# Patient Record
Sex: Male | Born: 1990 | Race: Black or African American | Hispanic: No | Marital: Single | Smoking: Never smoker
Health system: Southern US, Community
[De-identification: ages and names within clinical notes are randomized; demographics above are authoritative.]

---

## 2011-07-18 ENCOUNTER — Inpatient Hospital Stay (INDEPENDENT_AMBULATORY_CARE_PROVIDER_SITE_OTHER)
Admission: RE | Admit: 2011-07-18 | Discharge: 2011-07-18 | Disposition: A | Discharge status: Home / Self Care | Payer: BC Managed Care – PPO | Source: Ambulatory Visit | Attending: Emergency Medicine | Admitting: Emergency Medicine

## 2011-07-18 ENCOUNTER — Ambulatory Visit (INDEPENDENT_AMBULATORY_CARE_PROVIDER_SITE_OTHER): Payer: BC Managed Care – PPO

## 2011-07-18 DIAGNOSIS — S62233A Other displaced fracture of base of first metacarpal bone, unspecified hand, initial encounter for closed fracture: Secondary | ICD-10-CM

## 2013-01-20 IMAGING — CR DG HAND COMPLETE 3+V*R*
3 series · 3 of 3 positions shown · non-contrast
Comparison: None.

CLINICAL DATA: Right thumb injury.  Swollen.

RIGHT HAND - COMPLETE 3+ VIEW

[view not recorded (1 of 3)]
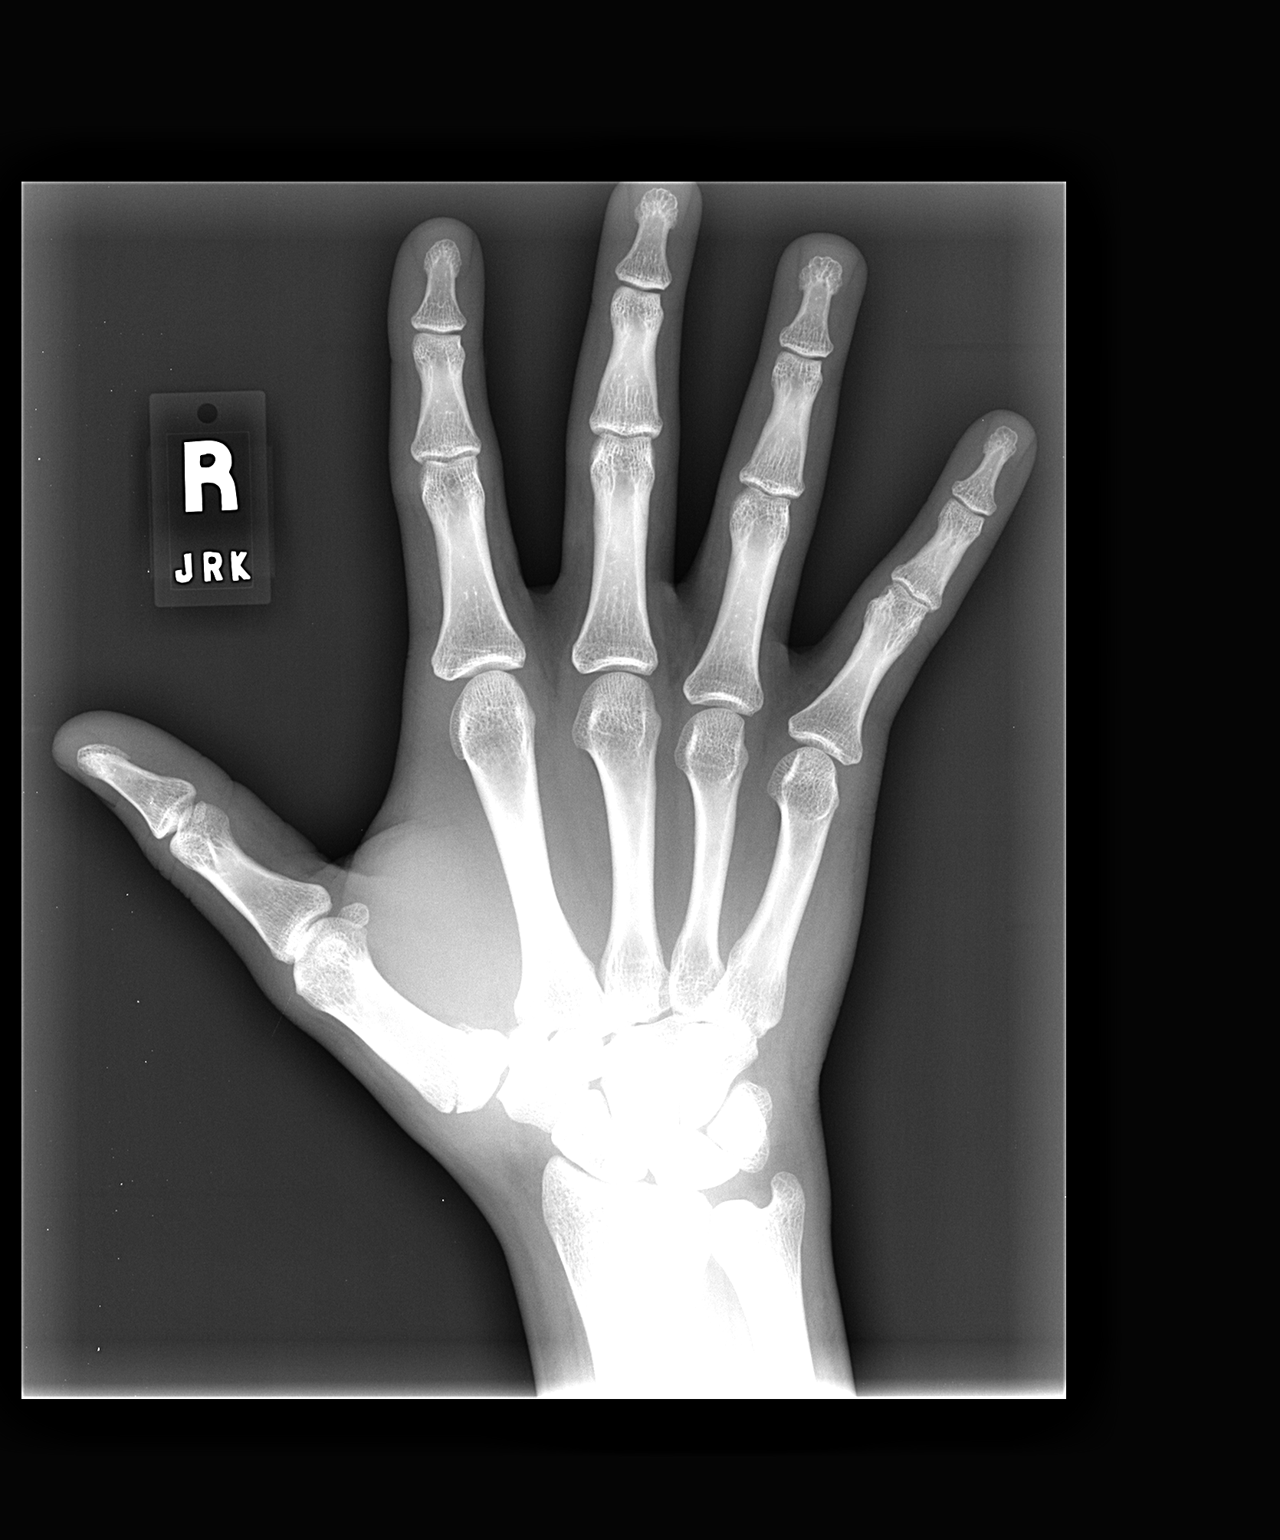

[view not recorded (2 of 3)]
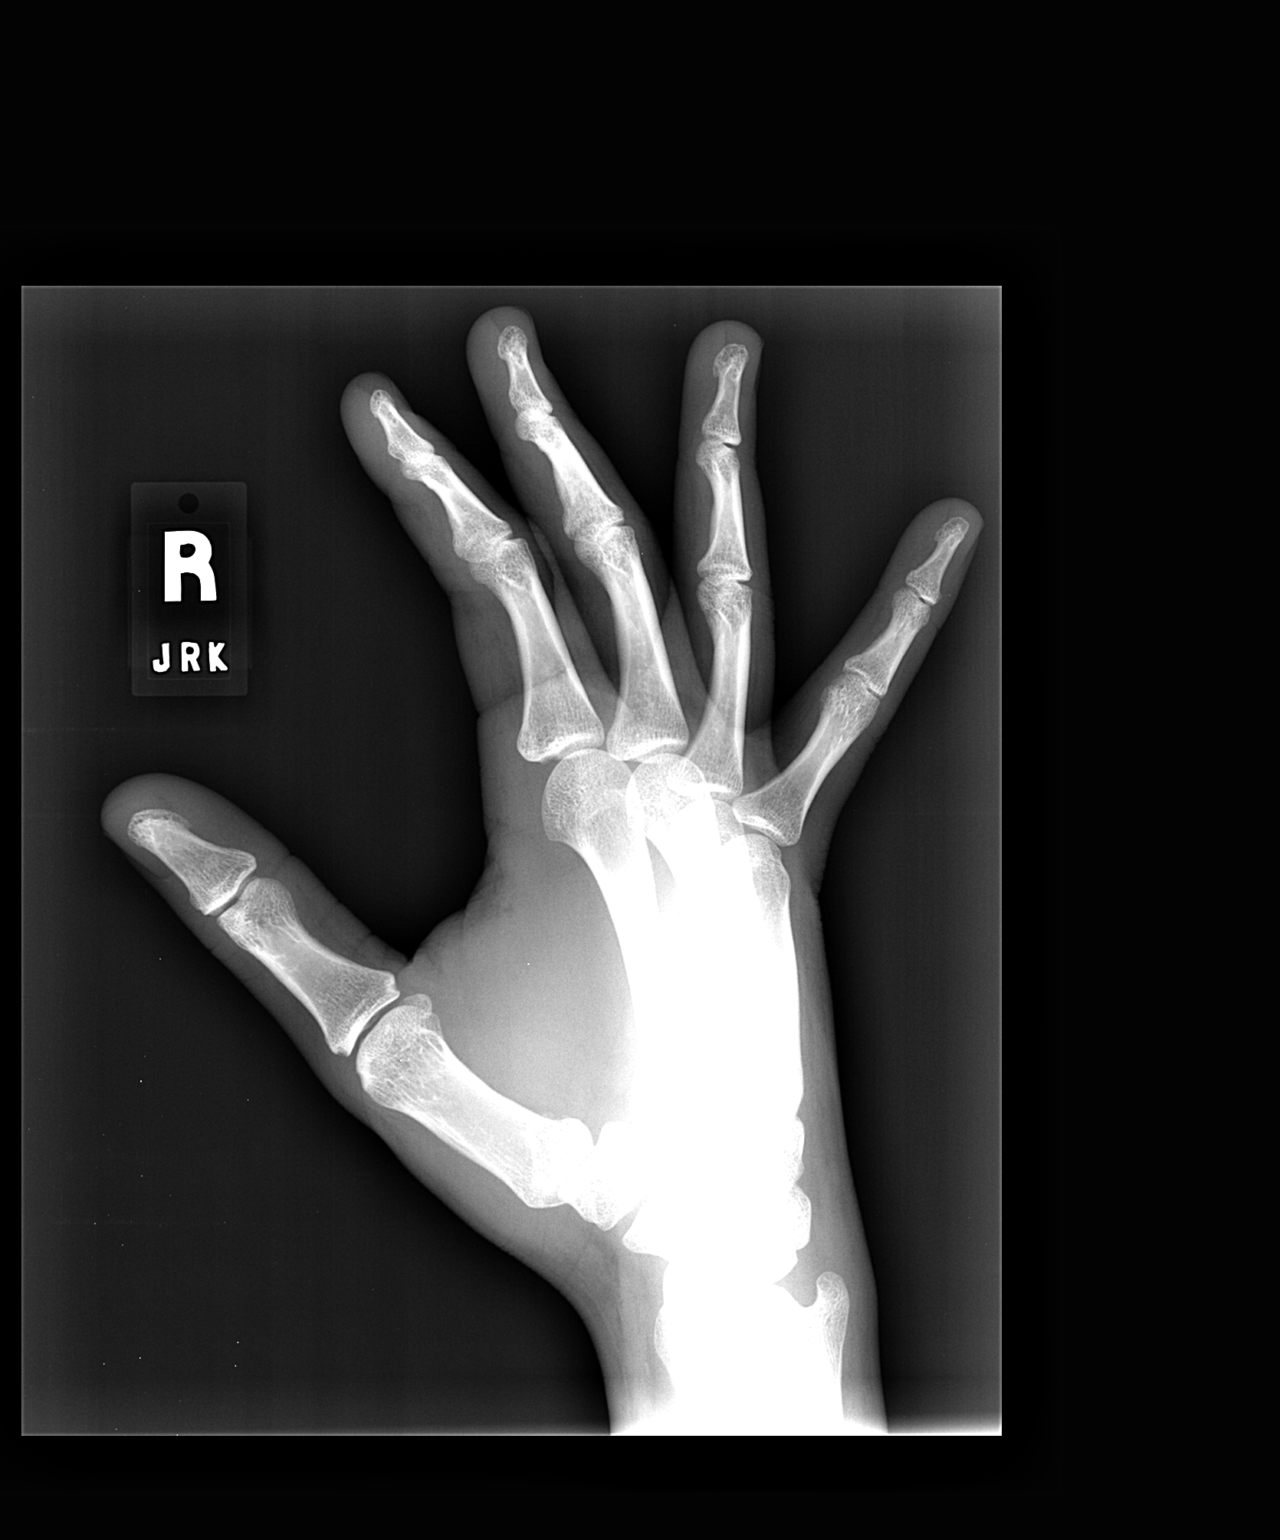

[view not recorded (3 of 3)]
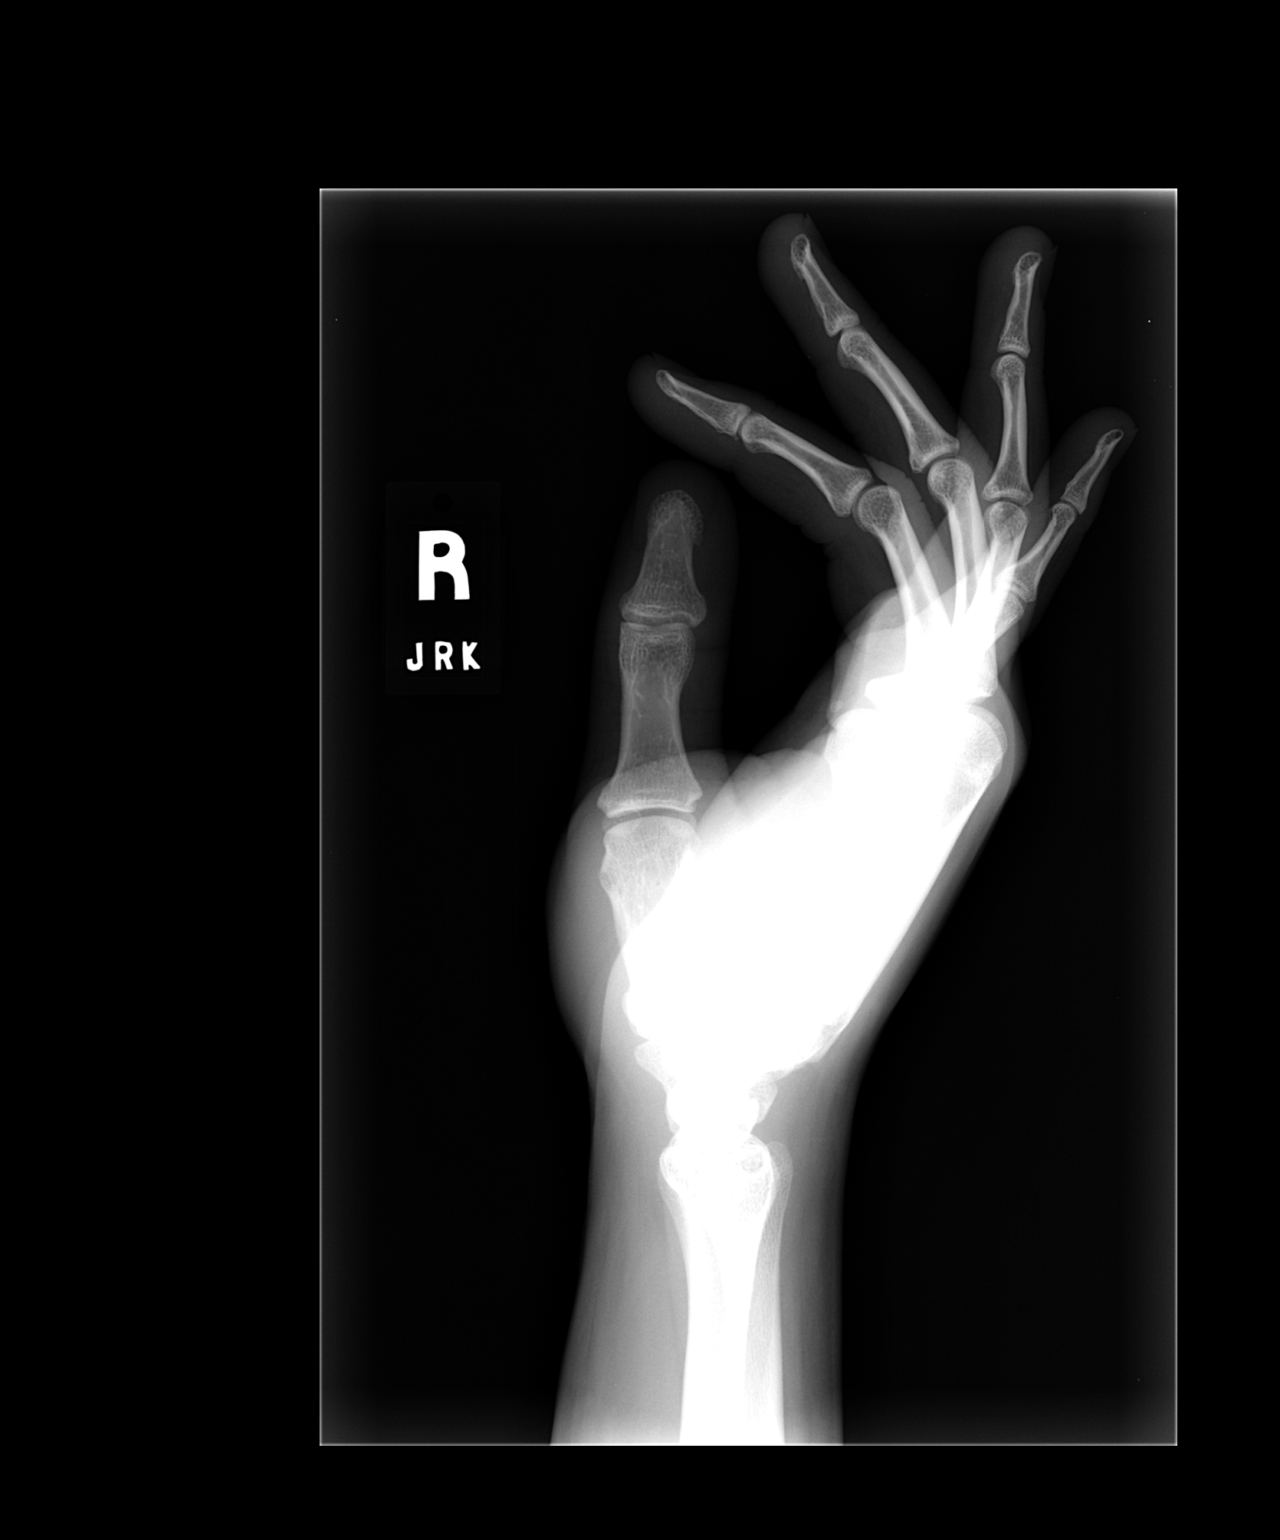

[3 of 3 positions shown; findings below may reference images not displayed]

FINDINGS: Transverse fracture proximal metaphysis of the first
metacarpal noted.

No other acute fracture or acute bony findings are observed.
IMPRESSION: 1.  Transverse fracture of the proximal metaphysis of the first
metacarpal.

## 2014-06-08 ENCOUNTER — Ambulatory Visit (HOSPITAL_COMMUNITY): Payer: BC Managed Care – PPO | Attending: Emergency Medicine

## 2014-06-08 ENCOUNTER — Emergency Department (INDEPENDENT_AMBULATORY_CARE_PROVIDER_SITE_OTHER)
Admission: EM | Admit: 2014-06-08 | Discharge: 2014-06-08 | Disposition: A | Discharge status: Home / Self Care | Payer: BC Managed Care – PPO | Source: Home / Self Care

## 2014-06-08 ENCOUNTER — Encounter (HOSPITAL_COMMUNITY): Payer: Self-pay | Admitting: Emergency Medicine

## 2014-06-08 DIAGNOSIS — S93409A Sprain of unspecified ligament of unspecified ankle, initial encounter: Secondary | ICD-10-CM

## 2014-06-08 DIAGNOSIS — M25579 Pain in unspecified ankle and joints of unspecified foot: Secondary | ICD-10-CM | POA: Insufficient documentation

## 2014-06-08 DIAGNOSIS — S93402A Sprain of unspecified ligament of left ankle, initial encounter: Secondary | ICD-10-CM

## 2014-06-08 DIAGNOSIS — Y9361 Activity, american tackle football: Secondary | ICD-10-CM

## 2014-06-08 NOTE — ED Provider Notes (Signed)
CSN: [id number]     Arrival date & time 06/08/14  1540 History   First MD Initiated Contact with Patient 06/08/14 1553     Chief Complaint  Patient presents with  . Ankle Pain   (Consider location/radiation/quality/duration/timing/severity/associated sxs/prior Treatment) HPI Comments: 23 year old male was playing football 2 days ago and injured his left ankle. He is complaining of pain to the ankle but not the foot. He is unable to place weight on the ankle but with pain.   History reviewed. No pertinent past medical history. History reviewed. No pertinent past surgical history. No family history on file. History  Substance Use Topics  . Smoking status: Never Smoker   . Smokeless tobacco: Not on file  . Alcohol Use: Yes    Review of Systems  Constitutional: Positive for activity change.  Respiratory: Negative.   Gastrointestinal: Negative.   Genitourinary: Negative.   Musculoskeletal: Positive for gait problem and joint swelling. Negative for back pain and neck pain.       As per HPI  Skin: Negative.   Neurological: Negative for dizziness and numbness.    Allergies  Review of patient's allergies indicates not on file.  Home Medications   Prior to Admission medications   Not on File   BP 131/85  Pulse 67  Temp(Src) 98.5 F (36.9 C) (Oral)  Resp 16  SpO2 98% Physical Exam  Nursing note and vitals reviewed. Constitutional: He is oriented to person, place, and time. He appears well-developed and well-nourished.  HENT:  Head: Normocephalic and atraumatic.  Eyes: EOM are normal.  Neck: Normal range of motion. Neck supple.  Cardiovascular: Normal rate.   Musculoskeletal: Normal range of motion. He exhibits edema and tenderness.  N/V, M/S intact. Pedal pulse 2+. Ext and flex intact.No foot tenderness or pain.  Neurological: He is alert and oriented to person, place, and time. No cranial nerve deficit.  Skin: Skin is warm and dry.  Psychiatric: He has a normal mood  and affect.    ED Course  Procedures (including critical care time) Labs Review Labs Reviewed - No data to display  Imaging Review Dg Ankle Complete Left  06/08/2014   CLINICAL DATA:  Twisted left ankle plain rugby, now with lateral ankle pain  EXAM: LEFT ANKLE COMPLETE - 3+ VIEW  COMPARISON:  None.  FINDINGS: Soft tissue swelling about the lateral malleolus. This finding without associated fracture or dislocation. Joint spaces are preserved. Ankle mortise is preserved. No ankle joint effusion.  IMPRESSION: Soft tissue swelling about the lateral malleolus without associated fracture or dislocation.   Electronically Signed   By: Simonne Come M.D.   On: 06/08/2014 16:59     MDM   1. Ankle sprain, left, initial encounter    [facility]    Hayden Rasmussen, NP 06/08/14 1706

## 2014-06-08 NOTE — Discharge Instructions (Signed)

## 2014-06-08 NOTE — ED Notes (Signed)
Pt  Reports  He  Injured  His  l  Ankle  2  Days  Ago     Playing  Football  He  Has  Pain on  Weight bearing  He  Has  Some  Swelling present  Cap  Refill is  Present

## 2014-06-09 NOTE — ED Provider Notes (Signed)
Medical screening examination/treatment/procedure(s) were performed by resident physician or non-physician practitioner and as supervising physician I was immediately available for consultation/collaboration.   Kimla Furth DOUGLAS MD.   Athenia Rys D Analy Bassford, MD 06/09/14 1708 

## 2015-12-12 IMAGING — CR DG ANKLE COMPLETE 3+V*L*
3 series · 3 of 3 positions shown · non-contrast
Comparison: None.

CLINICAL DATA: Twisted left ankle plain rugby, now with lateral
ankle pain

EXAM:
LEFT ANKLE COMPLETE - 3+ VIEW

[t ankle joint ap left]
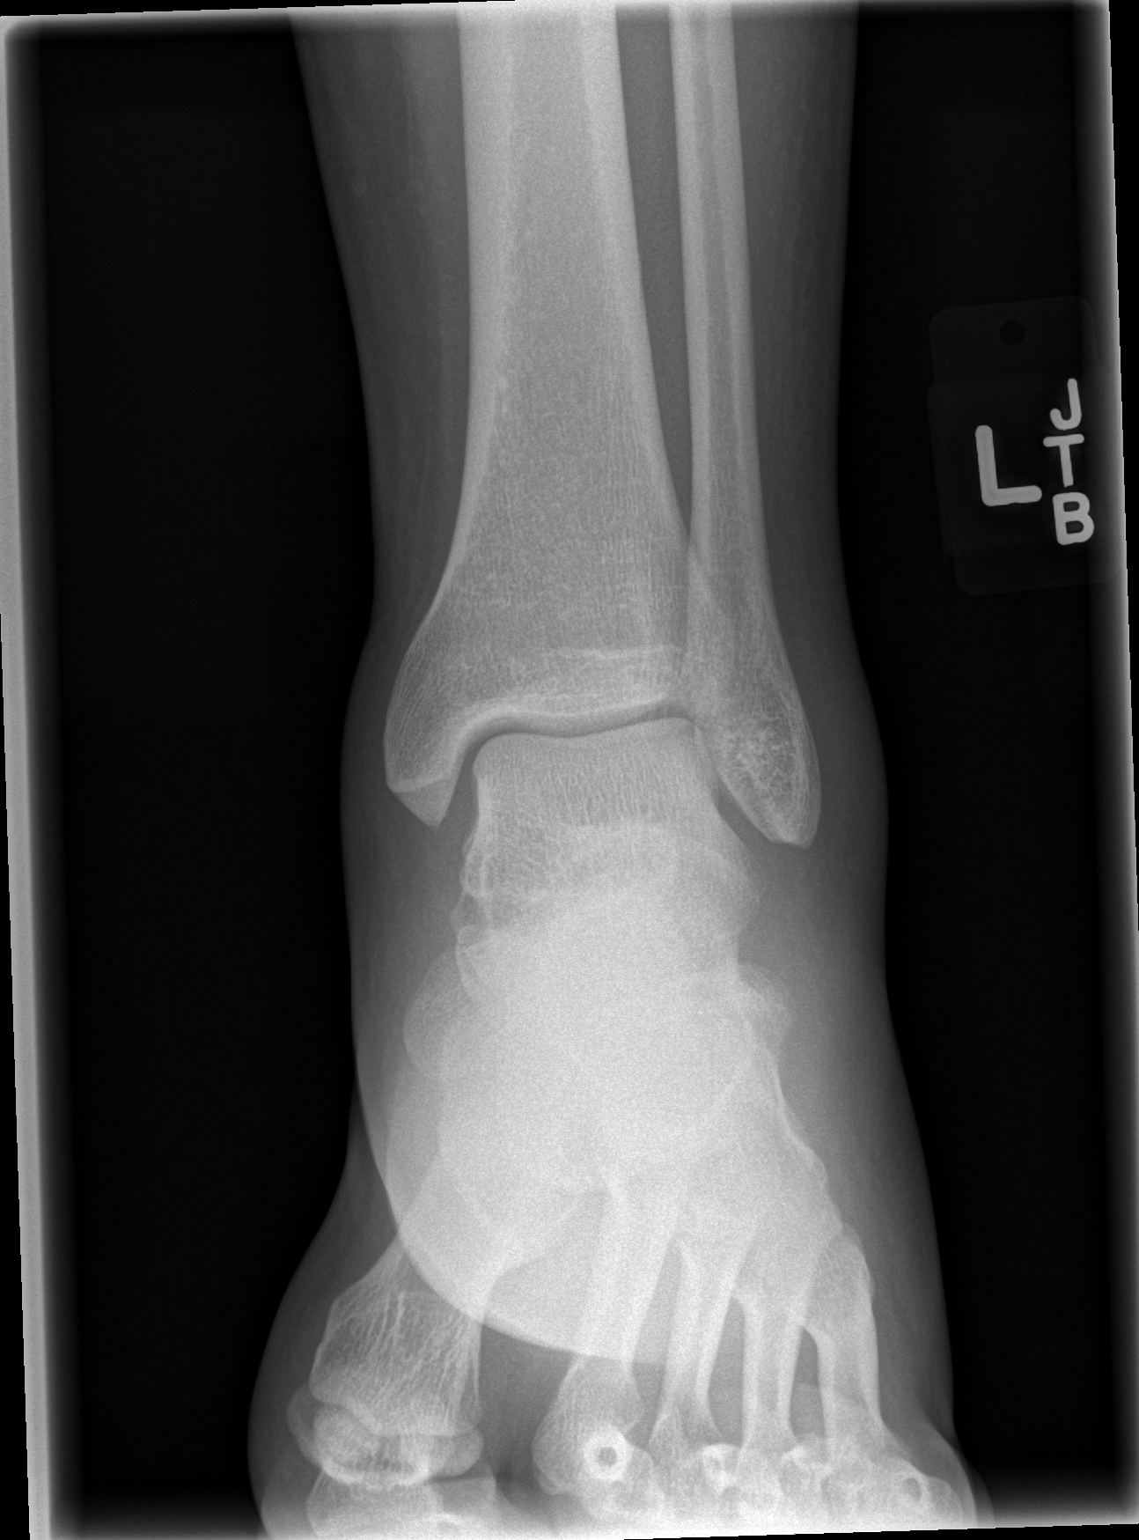

[t ankle joint oblique left]
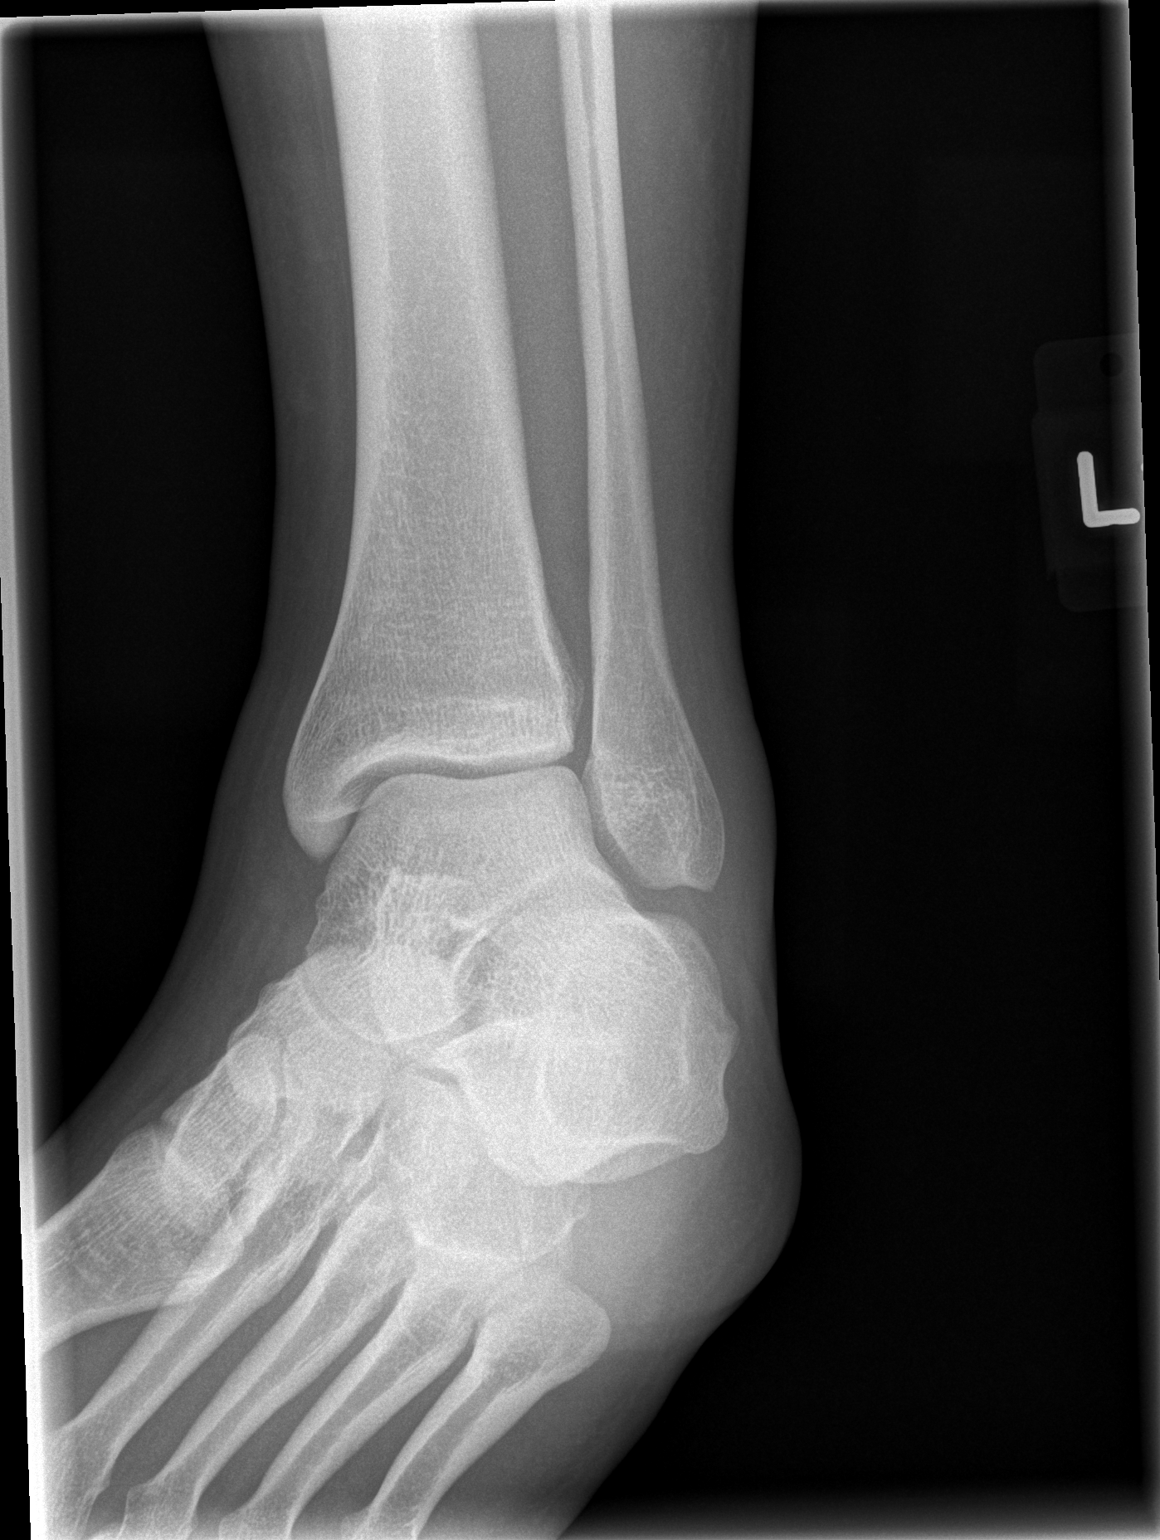

[t ankle joint lat left]
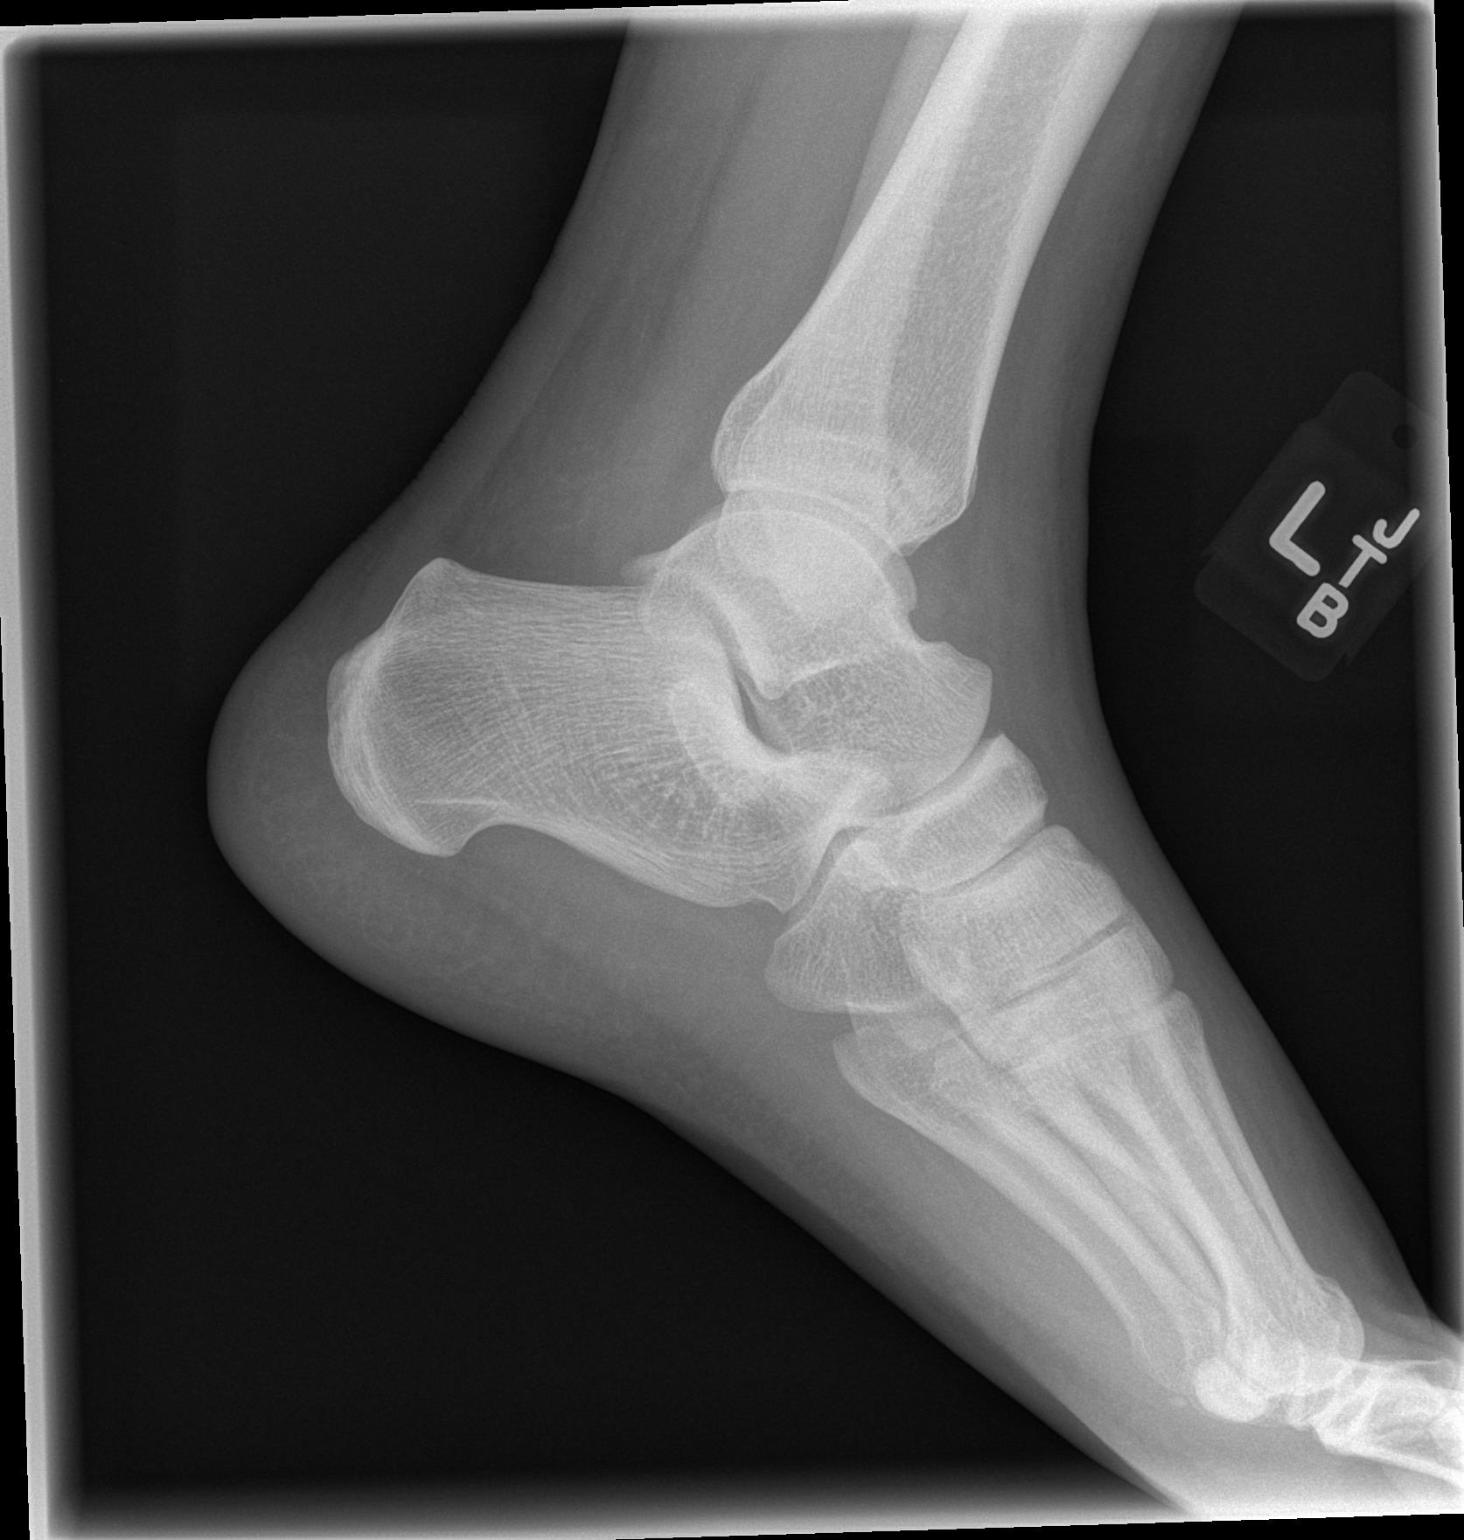

[3 of 3 positions shown; findings below may reference images not displayed]

FINDINGS: Soft tissue swelling about the lateral malleolus. This finding
without associated fracture or dislocation. Joint spaces are
preserved. Ankle mortise is preserved. No ankle joint effusion.
IMPRESSION: Soft tissue swelling about the lateral malleolus without associated
fracture or dislocation.

## 2016-11-25 ENCOUNTER — Encounter (HOSPITAL_COMMUNITY): Payer: Self-pay | Admitting: Emergency Medicine

## 2016-11-25 ENCOUNTER — Ambulatory Visit (HOSPITAL_COMMUNITY)
Admission: EM | Admit: 2016-11-25 | Discharge: 2016-11-25 | Disposition: A | Discharge status: Home / Self Care | Payer: Managed Care, Other (non HMO) | Attending: Family Medicine | Admitting: Family Medicine

## 2016-11-25 DIAGNOSIS — S83411A Sprain of medial collateral ligament of right knee, initial encounter: Secondary | ICD-10-CM

## 2016-11-25 MED ORDER — KETOROLAC TROMETHAMINE 60 MG/2ML IM SOLN
INTRAMUSCULAR | Status: AC
  Filled 2016-11-25: qty 2

## 2016-11-25 MED ORDER — KETOROLAC TROMETHAMINE 60 MG/2ML IM SOLN
60.0000 mg | Freq: Once | INTRAMUSCULAR | Status: AC
  Administered 2016-11-25: 60 mg via INTRAMUSCULAR

## 2016-11-25 MED ORDER — IBUPROFEN 600 MG PO TABS: 600.0000 mg | ORAL_TABLET | Freq: Four times a day (QID) | ORAL | 0 refills | Status: AC | PRN

## 2016-11-25 NOTE — ED Triage Notes (Signed)
C/o right knee pain States he was practicing rugby with teammates States when he stepped on right leg it bent towards left knee

## 2016-11-25 NOTE — ED Provider Notes (Signed)
  [facility]  CSN: [id number] Arrival date & time: 11/25/16  1020  History   Chief Complaint: Knee pain  HPI Trevor Herrera is a previously healthy 26 y.o. male who presents for right knee pain.   Last night he was practicing rugby, planted his right foot on the ground and had the right knee bend inward toward the left knee causing immediate medial right knee pain. There was a "pop." Pain continued constantly, though he was able to bear weight off the field and at home. He's applied ice and kept the extremity elevated with minimal improvement in pain. He denies significant swelling, took no medications. The pain is moderate-severe, aching, nonradiating. No numbness, tingling, or weakness associated.   History reviewed. No pertinent past medical history.  History reviewed. No pertinent surgical history.  Home Medications   None  Family History History reviewed. No pertinent family history.  Social History Nonsmoker, occasional drinker.  Allergies   Patient has no known allergies.  Review of Systems Review of Systems Per HPI.   Physical Exam  Physical Exam BP 117/77 (BP Location: Left Arm)   Pulse 77   Temp 98.4 F (36.9 C) (Oral)   Resp 16   SpO2 98%  Gen: Athletic-appearing 26 y.o.male in NAD Knee: No erythema, effusion or obvious bony abnormalities. There is medial joint line tenderness. No warmth, patellar tenderness, or condyle tenderness. ROM full in flexion and extension. There is no laxity in anterior/posterior drawer and varus/valgus stress. Valgus stress causes significant pain. Negative Mcmurray's. Non painful patellar compression. Patellar and quadriceps tendons unremarkable. Hamstring and quadriceps strength is normal. Distally motor and sensation intact.   UC Treatments / Results  Labs (all labs ordered are listed, but only abnormal results are displayed) Labs Reviewed - No data to display  EKG  EKG Interpretation None        Radiology No results found.  Procedures Procedures (including critical care time)  Medications Ordered in UC Medications  ketorolac (TORADOL) injection 60 mg (60 mg Intramuscular Given 11/25/16 1058)     Initial Impression / Assessment and Plan / UC Course  I have reviewed the triage vital signs and the nursing notes.  Pertinent labs & imaging results that were available during my care of the patient were reviewed by me and considered in my medical decision making (see chart for details).  Healthy 26 y.o. male presenting for valgus stress of right knee at rugby practice and resultant MCL sprain without laxity on exam.  - Apply knee sleeve, supply crutches, NSAID prn - Rest, no participation in sports until cleared by orthopedics.  - Will follow up with orthopedics on-call, [facility].  - Return precautions provided  Final Clinical Impressions(s) / UC Diagnoses   Final diagnoses:  Sprain of medial collateral ligament of right knee, initial encounter    New Prescriptions New Prescriptions   IBUPROFEN (ADVIL,MOTRIN) 600 MG TABLET    Take 1 tablet (600 mg total) by mouth every 6 (six) hours as needed.     Tyrone Nineyan B Lameshia Hypolite, MD 11/25/16 1116

## 2016-11-25 NOTE — ED Notes (Signed)
Knee  Immobilizer        Crutches    Applied

## 2016-11-25 NOTE — Discharge Instructions (Signed)
You have a sprain of your MCL (ligament) in your right knee. This is not a full tear requiring urgent referral, but you should call [facility] for further evaluation.  In the meantime: - Take ibuprofen as prescribed, continue wearing the knee sleeve.  - Ice the area and keep it elevated. - Use crutches until you have no pain with walking. - Do not participate in rugby or other sports until you are cleared by the orthopedic physician. - If your symptoms worsen, seek medical care immediately.

## 2016-12-02 ENCOUNTER — Ambulatory Visit (INDEPENDENT_AMBULATORY_CARE_PROVIDER_SITE_OTHER): Payer: Managed Care, Other (non HMO) | Admitting: Orthopedic Surgery

## 2016-12-02 ENCOUNTER — Encounter (INDEPENDENT_AMBULATORY_CARE_PROVIDER_SITE_OTHER): Payer: Self-pay | Admitting: Orthopedic Surgery

## 2016-12-02 ENCOUNTER — Ambulatory Visit (INDEPENDENT_AMBULATORY_CARE_PROVIDER_SITE_OTHER): Payer: Managed Care, Other (non HMO)

## 2016-12-02 DIAGNOSIS — S83511A Sprain of anterior cruciate ligament of right knee, initial encounter: Secondary | ICD-10-CM

## 2016-12-02 DIAGNOSIS — M25561 Pain in right knee: Secondary | ICD-10-CM

## 2016-12-02 NOTE — Progress Notes (Signed)
   Office Visit Note   Patient: Trevor Herrera           Date of Birth: [birth year]           MRN: [id number] Visit Date: 12/02/2016 Requested by: No referring provider defined for this encounter. PCP: Pcp Not In System  Subjective: Chief Complaint  Patient presents with  . 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  He's been unable to fully extend the knee since that time.  This was a valgus-type injury. He reports tightness when he tries to flex the knee.  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  He ist taking ibuprofen for his symptoms.  No family history of deep pain thrombosis or pulmonary embolism.              ROS: All systems reviewed are negative as they relate to the chief complaint within the history of present illness.  Patient denies  fevers or chills.   Assessment & Plan: Visit Diagnoses:  1. Acute pain of right knee     Plan: impression is right knee anterior cruciate ligament tear with possible locked meniscal tear.  He is lacking the last 15 of full extension.  Collaterals feel stable.  Plan is for MRI as soon as possible to evaluate this locked knee.  I do want him working on flexion is much as possible.  Discussed aspirating the knee today but we will hold off on that.  I'll see him back after his scan  Follow-Up Instructions: No Follow-up on file.   Orders:  Orders Placed This Encounter  Procedures  . XR KNEE 3 VIEW RIGHT   No orders of the defined types were placed in this encounter.     Procedures: No procedures performed   Clinical Data: No additional findings.  Objective: Vital Signs: There were no vitals taken for this visit.  Physical Exam:   Constitutional: Patient appears well-developed HEENT:  Head: Normocephalic Eyes:EOM are normal Neck: Normal range of motion Cardiovascular: Normal  rate Pulmonary/chest: Effort normal Neurologic: Patient is alert Skin: Skin is warm Psychiatric: Patient has normal mood and affect    Ortho Exam: orthopedic exam demonstrates anterior cruciate ligament laxity collateral ligament stability to varus and valgus testing at 0 and 30 palpable pedal pulses no posterior lateral rotatory instability.  He's lacking 15 of full extension.  Has an effusion.  Extensor mechanism is intact.  No groin pain with internal/external rotation of the leg.  Specialty Comments:  No specialty comments available.  Imaging: Xr Knee 3 View Right  Result Date: 12/02/2016 3 views right knee reviewed.  AP lateral and merchant.  No fracture present.  The fusion present.  Patellas have slight tilt bilaterally.    PMFS History: Patient Active Problem List   Diagnosis Date Noted  . Acute pain of right knee 12/02/2016   No past medical history on file.  No family history on file.  No past surgical history on file. Social History   Occupational History  . Not on file.   Social History Main Topics  . Smoking status: Never Smoker  . Smokeless tobacco: Never Used  . Alcohol use Yes  . Drug use: Unknown  . Sexual activity: Not on file

## 2016-12-03 ENCOUNTER — Ambulatory Visit
Admission: RE | Admit: 2016-12-03 | Discharge: 2016-12-03 | Disposition: A | Discharge status: Home / Self Care | Payer: Managed Care, Other (non HMO) | Source: Ambulatory Visit | Attending: Orthopedic Surgery | Admitting: Orthopedic Surgery

## 2016-12-03 DIAGNOSIS — M25561 Pain in right knee: Secondary | ICD-10-CM

## 2016-12-03 DIAGNOSIS — S83511A Sprain of anterior cruciate ligament of right knee, initial encounter: Secondary | ICD-10-CM

## 2016-12-23 ENCOUNTER — Encounter (INDEPENDENT_AMBULATORY_CARE_PROVIDER_SITE_OTHER): Payer: Self-pay | Admitting: Orthopedic Surgery

## 2016-12-23 ENCOUNTER — Other Ambulatory Visit (INDEPENDENT_AMBULATORY_CARE_PROVIDER_SITE_OTHER): Payer: Self-pay | Admitting: Orthopedic Surgery

## 2016-12-23 ENCOUNTER — Ambulatory Visit (INDEPENDENT_AMBULATORY_CARE_PROVIDER_SITE_OTHER): Payer: Managed Care, Other (non HMO) | Admitting: Orthopedic Surgery

## 2016-12-23 DIAGNOSIS — S83511A Sprain of anterior cruciate ligament of right knee, initial encounter: Secondary | ICD-10-CM

## 2016-12-23 DIAGNOSIS — S83261A Peripheral tear of lateral meniscus, current injury, right knee, initial encounter: Secondary | ICD-10-CM | POA: Diagnosis not present

## 2016-12-23 NOTE — Progress Notes (Signed)
Office Visit Note   Patient: Trevor BaileyChristopher Whorley           Date of Birth: [birth year]           MRN: [id number] Visit Date: 12/23/2016 Requested by: No referring provider defined for this encounter. PCP: Pcp Not In System  Subjective: Chief Complaint  Patient presents with  . Right Knee - Follow-up    HPI: Trevor DeerChristopher is a 26 year old patient with right knee pain and instability.  Since of [place] she's had an MRI scan which is reviewed today.  He does have anterior cruciate ligament tear as well as lateral meniscal tear.  There is no blocked meniscal tear blocking extension.  He has improved his range of motion since I last seen him.  No family history of DVT or pulmonary embolism.  He does live on the second floor.  His brother is here with him.  [place] in [place] in [place] in June.  He is a Consulting civil engineerstudent at [employer].              ROS: All systems reviewed are negative as they relate to the chief complaint within the history of present illness.  Patient denies  fevers or chills.   Assessment & Plan: Visit Diagnoses:  1. Rupture of anterior cruciate ligament of right knee, initial encounter   2. Peripheral tear of lateral meniscus of right knee, unspecified whether old or current tear, initial encounter     Plan: Impression is right knee anterior cruciate ligament tear with symptomatic instability.  Lateral meniscal tear is also present which is not a full-thickness tear.  No posterior lateral rotatory instability is noted.  Plan at this time is anterior cruciate ligament reconstruction using hamstring autograft.  Also will look at that lateral meniscus and consider all inside repair versus inside out repair depending on the mechanics of the tear itself.  Risks and benefits discussed with the patient including not limited to infection or vessel damage knee stiffness as well as delayed return to sports.  Patient understands the risks and benefits.  All questions answered.  Follow-Up Instructions:  No Follow-up on file.   Orders:  No orders of the defined types were placed in this encounter.  No orders of the defined types were placed in this encounter.     Procedures: No procedures performed   Clinical Data: No additional findings.  Objective: Vital Signs: There were no vitals taken for this visit.  Physical Exam:   Constitutional: Patient appears well-developed HEENT:  Head: Normocephalic Eyes:EOM are normal Neck: Normal range of motion Cardiovascular: Normal rate Pulmonary/chest: Effort normal Neurologic: Patient is alert Skin: Skin is warm Psychiatric: Patient has normal mood and affect    Ortho Exam: Orthopedic examination of the right knee demonstrates mild effusion flexion easily past 90 he actually has achieved full extension to within 12 which is much better than the 15 flexion contracture he had last time I saw him.  Collaterals are stable.  PCL is intact anterior cruciate ligament is out.  There is no posterior lateral rotatory instability.  Specialty Comments:  No specialty comments available.  Imaging: No results found.   PMFS History: Patient Active Problem List   Diagnosis Date Noted  . Acute pain of right knee 12/02/2016   No past medical history on file.  No family history on file.  No past surgical history on file. Social History   Occupational History  . Not on file.   Social History Main Topics  .  Smoking status: Never Smoker  . Smokeless tobacco: Never Used  . Alcohol use Yes  . Drug use: Unknown  . Sexual activity: Not on file

## 2016-12-24 ENCOUNTER — Telehealth (INDEPENDENT_AMBULATORY_CARE_PROVIDER_SITE_OTHER): Payer: Self-pay | Admitting: Orthopedic Surgery

## 2016-12-24 NOTE — Telephone Encounter (Signed)
Patient called to cancel his surgery for 01/01/17. States unable to afford it at this time and will call me when he is ready to schedule surgery.

## 2017-01-01 ENCOUNTER — Ambulatory Visit (HOSPITAL_COMMUNITY)
Admission: RE | Admit: 2017-01-01 | Payer: Managed Care, Other (non HMO) | Source: Ambulatory Visit | Admitting: Orthopedic Surgery

## 2017-01-01 ENCOUNTER — Encounter (HOSPITAL_COMMUNITY): Admission: RE | Payer: Self-pay | Source: Ambulatory Visit

## 2017-01-01 SURGERY — RECONSTRUCTION, KNEE, ACL, USING HAMSTRING GRAFT
Anesthesia: General | Site: Knee | Laterality: Right

## 2017-01-08 ENCOUNTER — Inpatient Hospital Stay (INDEPENDENT_AMBULATORY_CARE_PROVIDER_SITE_OTHER): Payer: Managed Care, Other (non HMO) | Admitting: Orthopedic Surgery

## 2017-01-29 ENCOUNTER — Telehealth (INDEPENDENT_AMBULATORY_CARE_PROVIDER_SITE_OTHER): Payer: Self-pay | Admitting: Orthopedic Surgery

## 2017-01-29 DIAGNOSIS — M25561 Pain in right knee: Secondary | ICD-10-CM

## 2017-01-29 NOTE — Telephone Encounter (Signed)
Ok to make referral

## 2017-01-29 NOTE — Telephone Encounter (Signed)
PT CALLED AND WANTS TO SEE IF WE CAN GIVE HIM A REFERRAL TO AN ORTHO IN HIS NEW AREA: [place], [place]. HE IS MOVING AND WOULD LIKE TO BE REFERRED TO SOMEONE THERE.  [phone]

## 2017-01-30 NOTE — Telephone Encounter (Signed)
The only person there I know is mark curzan

## 2017-02-01 NOTE — Telephone Encounter (Signed)
Referral made. Patient is aware he will be contacted for appointment.

## 2017-02-01 NOTE — Addendum Note (Signed)
Addended byPrescott Parma: Shamiyah Ngu on: 02/01/2017 08:27 AM   Modules accepted: Orders

## 2018-06-08 IMAGING — MR MR KNEE*R* W/O CM
4 of 6 series · 21 of 40 positions shown · non-contrast
Comparison: None.

CLINICAL DATA: Twisting injury right knee while running 9 days ago.

EXAM:
MRI OF THE RIGHT KNEE WITHOUT CONTRAST
TECHNIQUE: Multiplanar, multisequence MR imaging of the knee was performed. No
intravenous contrast was administered.

[Series 4: pd_tse_fs_tra · axial · 4.0mm · 0.42mm/px · z∈[-17,+71]mm · 3 of 26 slices shown]
[im 6/26]
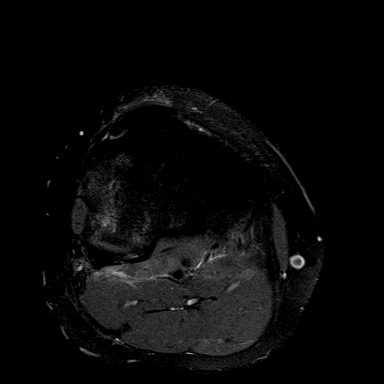
[im 16/26]
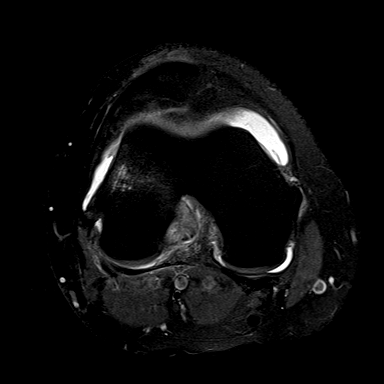
[im 26/26]
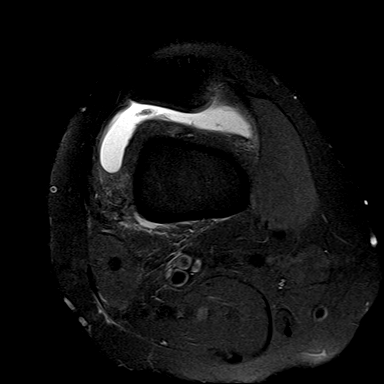

[Series 6: T2 fat-sat · coronal · 3.2mm · 0.62mm/px · 8 of 28 slices shown]
[im 1/28]
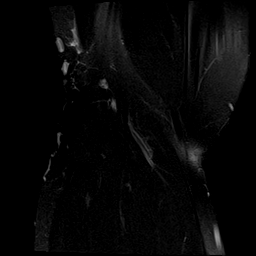
[im 4/28]
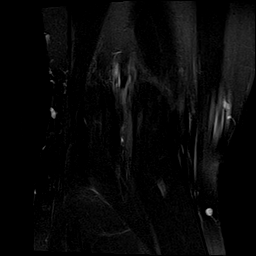
[im 8/28]
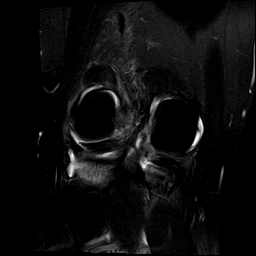
[im 12/28]
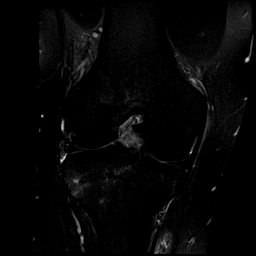
[im 16/28]
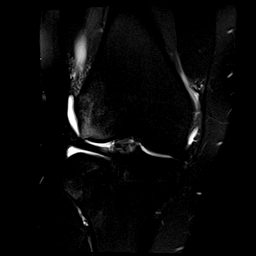
[im 20/28]
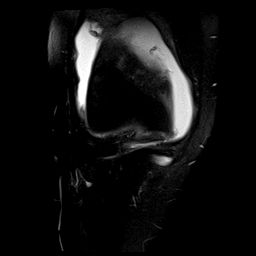
[im 24/28]
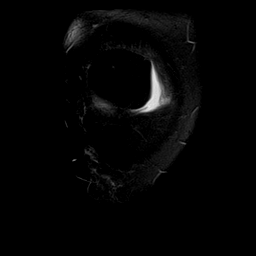
[im 28/28]
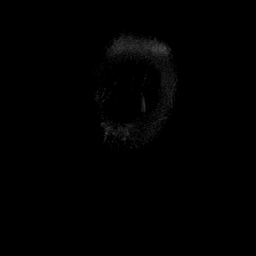

[Series 7: T1 · coronal · 3.2mm · 0.25mm/px · 3 of 28 slices shown]
[im 4/28]
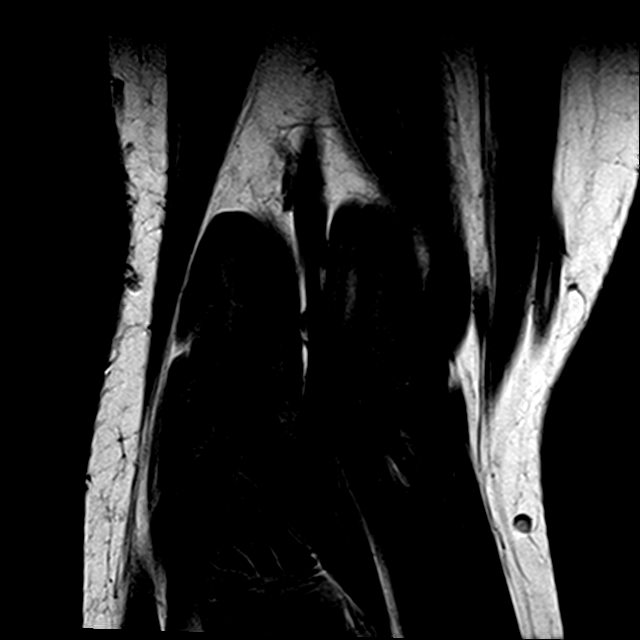
[im 16/28]
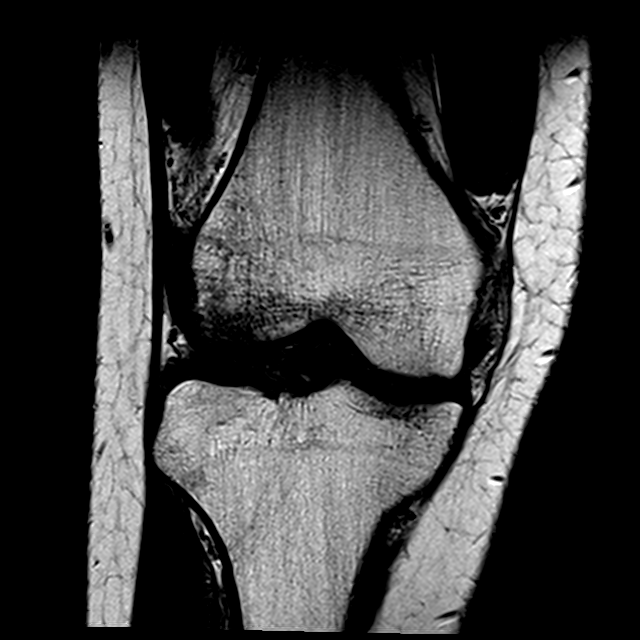
[im 24/28]
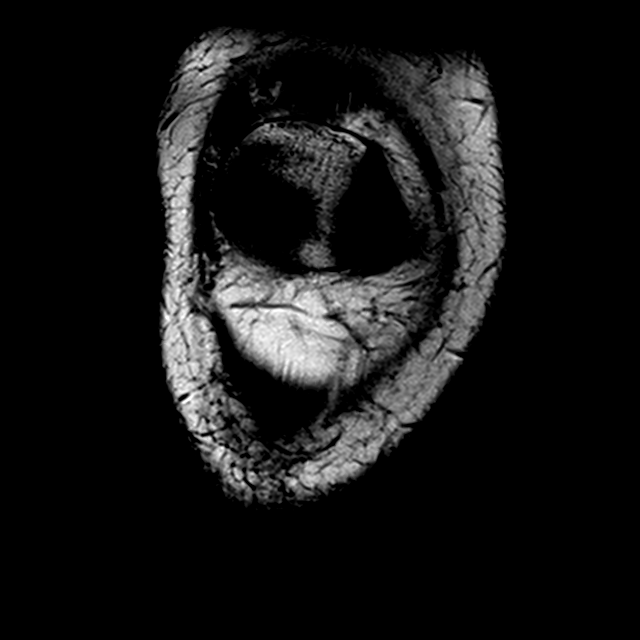

[Series 8: PD fat-sat · sagittal · 3.5mm · 0.25mm/px · 7 of 26 slices shown]
[im 1/26]
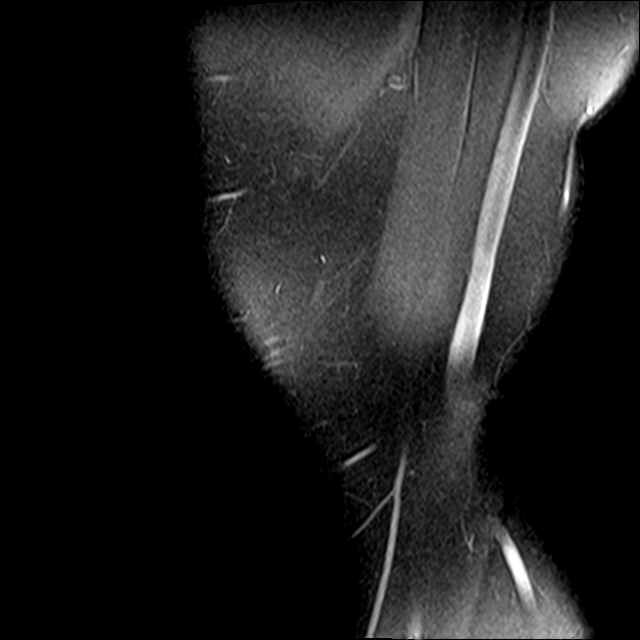
[im 5/26]
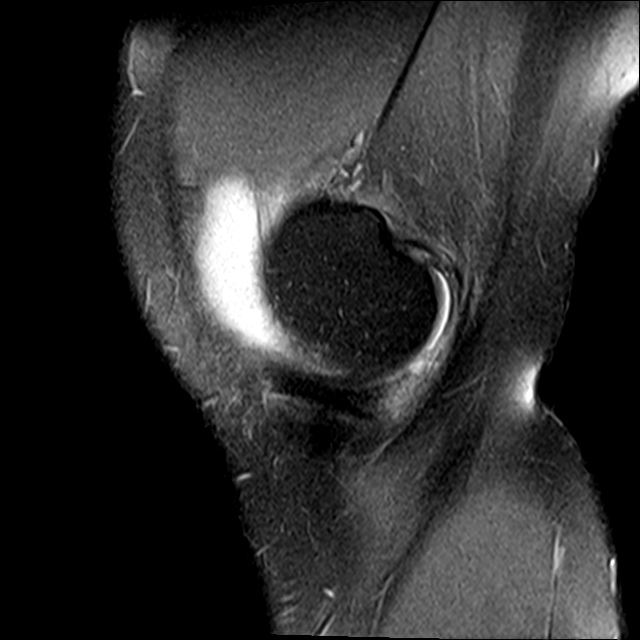
[im 9/26]
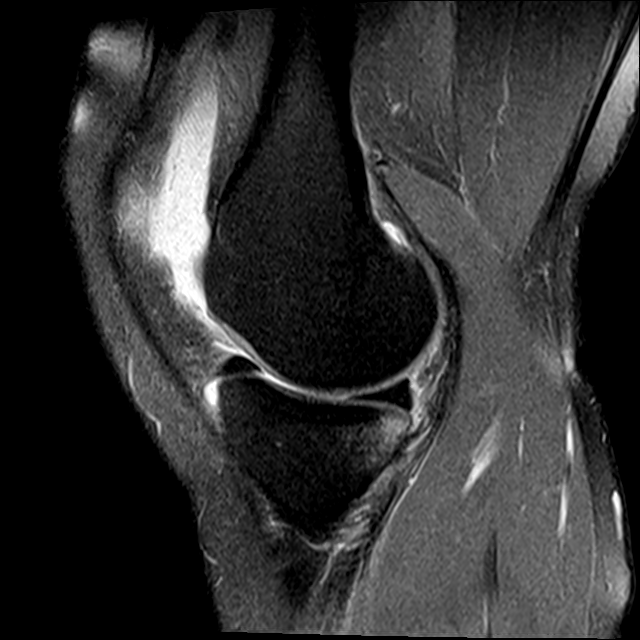
[im 13/26]
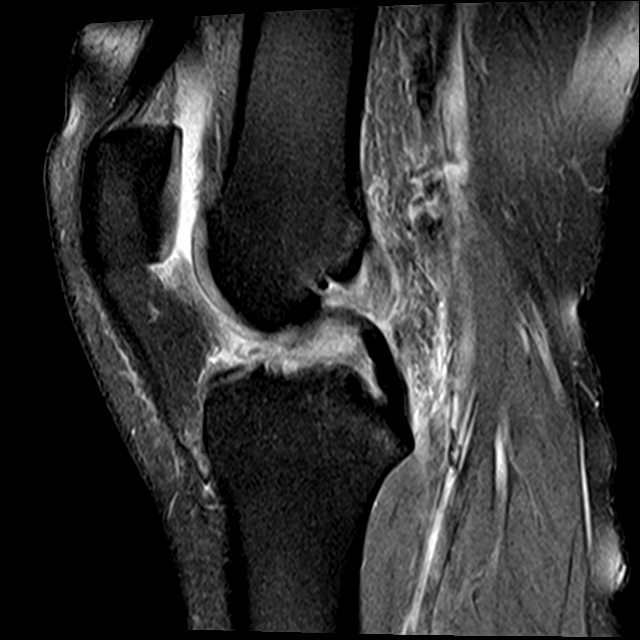
[im 17/26]
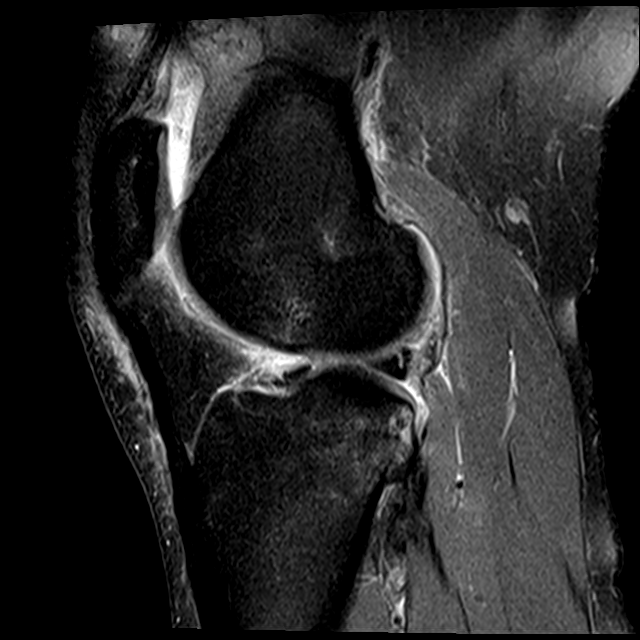
[im 21/26]
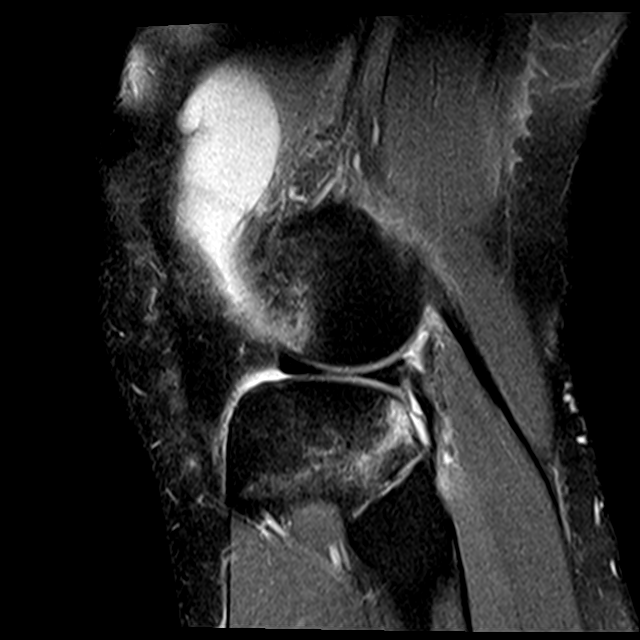
[im 26/26]
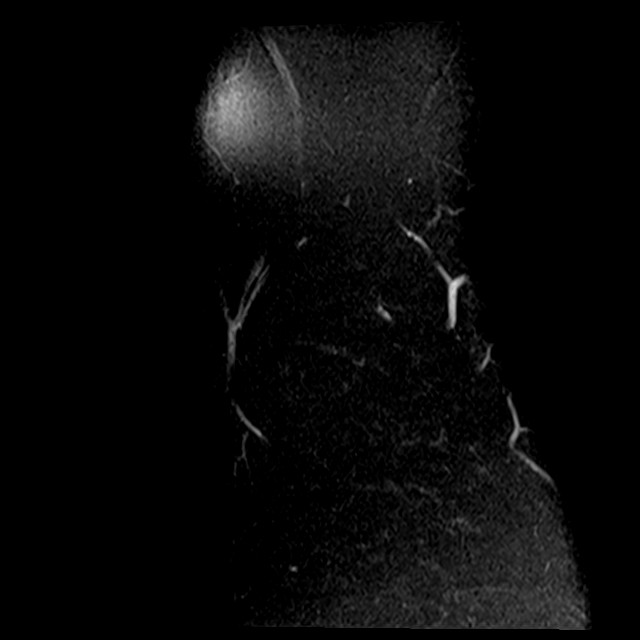

[21 of 40 positions shown; findings below may reference images not displayed]

FINDINGS: MENISCI

Medial meniscus:  Intact.

Lateral meniscus: There is a longitudinal tear in the periphery of
the posterior horn which is contiguous with the ligament of Wrisberg
consistent with a Wrisberg rip. No displaced fragment.

LIGAMENTS

Cruciates:  The ACL is completely torn.  The PCL is intact.

Collaterals:  Intact.

CARTILAGE

Patellofemoral:  Normal.

Medial:  Normal.

Lateral:  Normal.

Joint:  Moderate joint effusion.

Popliteal Fossa:  No Baker's cyst.

Extensor Mechanism:  Intact.

Bones: Bone contusions are seen about the knee. Minimal subchondral
impaction fracture in the anterior weight-bearing surface of the
lateral femoral condyle consistent with pivot-shift injury is noted.

Other: None.
IMPRESSION: Complete ACL tear with associated bone contusions joint effusion.
Mild subchondral impaction fracture anterior weight-bearing surface
lateral femoral condyle is noted.

Longitudinal tear in the periphery of the posterior horn of the
lateral meniscus consistent with a Wrisberg rip.
